# Patient Record
Sex: Female | Born: 1980 | Race: White | Hispanic: No | Marital: Married | Smoking: Never smoker
Health system: Southern US, Community
[De-identification: ages and names within clinical notes are randomized; demographics above are authoritative.]

## PROBLEM LIST (undated history)

## (undated) DIAGNOSIS — D649 Anemia, unspecified: Secondary | ICD-10-CM

## (undated) DIAGNOSIS — K219 Gastro-esophageal reflux disease without esophagitis: Secondary | ICD-10-CM

## (undated) DIAGNOSIS — J449 Chronic obstructive pulmonary disease, unspecified: Secondary | ICD-10-CM

## (undated) DIAGNOSIS — R000 Tachycardia, unspecified: Secondary | ICD-10-CM

## (undated) DIAGNOSIS — R519 Headache, unspecified: Secondary | ICD-10-CM

## (undated) HISTORY — DX: Tachycardia, unspecified: R00.0

## (undated) HISTORY — PX: OTHER SURGICAL HISTORY: SHX169

## (undated) HISTORY — DX: Chronic obstructive pulmonary disease, unspecified: J44.9

## (undated) HISTORY — PX: APPENDECTOMY: SHX54

---

## 2019-10-12 ENCOUNTER — Ambulatory Visit: Payer: BC Managed Care – PPO | Admitting: Neurology

## 2019-10-12 ENCOUNTER — Encounter: Payer: Self-pay | Admitting: Neurology

## 2019-10-12 ENCOUNTER — Other Ambulatory Visit: Payer: Self-pay

## 2019-10-12 VITALS — BP 106/67 | HR 96 | Temp 98.5°F | Ht 60.0 in | Wt 110.0 lb

## 2019-10-12 DIAGNOSIS — R251 Tremor, unspecified: Secondary | ICD-10-CM

## 2019-10-12 DIAGNOSIS — R3911 Hesitancy of micturition: Secondary | ICD-10-CM

## 2019-10-12 DIAGNOSIS — M79602 Pain in left arm: Secondary | ICD-10-CM

## 2019-10-12 DIAGNOSIS — M545 Low back pain, unspecified: Secondary | ICD-10-CM

## 2019-10-12 MED ORDER — CYCLOBENZAPRINE HCL 5 MG PO TABS: 5.0000 mg | ORAL_TABLET | Freq: Every day | ORAL | 3 refills | Status: AC

## 2019-10-12 NOTE — Progress Notes (Signed)
[facility]  PATIENT: Melissa Morse DOB: 11/08/1981  REFERRING DOCTOR OR PCP:  Glendale Chard (PCP); Dr. Annette Stable (Neurosurgery) SOURCE: Patient, notes from neurosurgery, imaging reports, lab reports, MRI images personally reviewed.  _________________________________   HISTORICAL  CHIEF COMPLAINT:  Chief Complaint  Patient presents with  . Referral    Referral for thoraic myelopathy room, back pain and arms shake all the time and legs shake pt alone     HISTORY OF PRESENT ILLNESS:  I had the pleasure of seeing your patient, Melissa Morse, at [facility] for neurologic consultation regarding a concern about thoracic myelopathy.  She is a 38 year old woman who was having lower back pain for the past year.   She has  trembling that started in her legs in August while driving to the beach and became more frequent a month ago.   Tremor started in her arm earlier this week.      Tremors are in both legs and the left arm.  The tremor fluctuates in intensity and she notes it more at night.   She has had urinary hesitancy for the past month as well.   She feels her legs are tired though not much weakness.   She denies numbness.    She saw Dr. Annette Stable September 28, 2019 and MRI of the lumbar and thoracic spine were ordered.    I personally reviewed the MRIs of the thoracic and lumbar spine performed at [facility] 09/29/2019.  The spinal cord appears normal.  There are minimal disc degenerative changes at T7-T8 and L5-S1 but no nerve root compression.  No spinal stenosis.  She reports a recent episode of tachycardia last week.   She reports pulse went from 78 to 160.   She went to the [facility] ED and her pulse was 124.   She was prescribed metoprolol and pulse is less than 100.      She has no trauma to her back.   She reports having 3 Epidural block for deliveries and 1 spinal for C-section delivery (4 boys, ages 72 to 52; three have autism).  She  denies depression or anxiety.  She is sleeping worse at night the last month due to bladder issues and tremor.    Labwork from 09/30/2019 showed normal electrolytes, TSH, CBC.  REVIEW OF SYSTEMS: Constitutional: No fevers, chills, sweats, or change in appetite Eyes: No visual changes, double vision, eye pain Ear, nose and throat: No hearing loss, ear pain, nasal congestion, sore throat Cardiovascular: No chest pain, palpitations Respiratory: No shortness of breath at rest or with exertion.   No wheezes GastrointestinaI: No nausea, vomiting, diarrhea, abdominal pain, fecal incontinence Genitourinary: No dysuria, urinary retention or frequency.  No nocturia. Musculoskeletal: No neck pain, back pain Integumentary: No rash, pruritus, skin lesions Neurological: as above Psychiatric: No depression at this time.  No anxiety Endocrine: No palpitations, diaphoresis, change in appetite, change in weigh or increased thirst Hematologic/Lymphatic: No anemia, purpura, petechiae. Allergic/Immunologic: No itchy/runny eyes, nasal congestion, recent allergic reactions, rashes  ALLERGIES: Not on File  HOME MEDICATIONS:  Current Outpatient Medications:  .  Albuterol Sulfate 108 (90 Base) MCG/ACT AEPB, Inhale into the lungs., Disp: , Rfl:  .  Fluticasone-Salmeterol (ADVAIR) 250-50 MCG/DOSE AEPB, Inhale into the lungs., Disp: , Rfl:  .  metoprolol succinate (TOPROL-XL) 25 MG 24 hr tablet, Take by mouth., Disp: , Rfl:  .  montelukast (SINGULAIR) 10 MG tablet, Take by mouth., Disp: , Rfl:  .  naproxen (NAPROSYN)  500 MG tablet, Take by mouth., Disp: , Rfl:  .  traMADol (ULTRAM) 50 MG tablet, Take by mouth., Disp: , Rfl:   PAST MEDICAL HISTORY: Past Medical History:  Diagnosis Date  . COPD (chronic obstructive pulmonary disease) ([place])   . Tachycardia     PAST SURGICAL HISTORY: C-section.  FAMILY HISTORY: No family history on file.  SOCIAL HISTORY:  Social History   Socioeconomic History   . Marital status: Married    Spouse name: Not on file  . Number of children: Not on file  . Years of education: Not on file  . Highest education level: Not on file  Occupational History  . Not on file  Social Needs  . Financial resource strain: Not on file  . Food insecurity    Worry: Not on file    Inability: Not on file  . Transportation needs    Medical: Not on file    Non-medical: Not on file  Tobacco Use  . Smoking status: Never Smoker  . Smokeless tobacco: Never Used  Substance and Sexual Activity  . Alcohol use: Not Currently  . Drug use: Not Currently  . Sexual activity: Not on file  Lifestyle  . Physical activity    Days per week: Not on file    Minutes per session: Not on file  . Stress: Not on file  Relationships  . Social Herbalist on phone: Not on file    Gets together: Not on file    Attends religious service: Not on file    Active member of club or organization: Not on file    Attends meetings of clubs or organizations: Not on file    Relationship status: Not on file  . Intimate partner violence    Fear of current or ex partner: Not on file    Emotionally abused: Not on file    Physically abused: Not on file    Forced sexual activity: Not on file  Other Topics Concern  . Not on file  Social History Narrative  . Not on file     PHYSICAL EXAM  Vitals:   10/12/19 1427  BP: 106/67  Pulse: 96  Temp: 98.5 F (36.9 C)  Weight: 110 lb (49.9 kg)  Height: 5' (1.524 m)    Body mass index is 21.48 kg/m.   General: The patient is well-developed and well-nourished and in no acute distress  HEENT:  Head is [place]/AT.  Sclera are anicteric.  Funduscopic exam shows normal optic discs and retinal vessels.  Neck: No carotid bruits are noted.  The neck is nontender.  Cardiovascular: The heart has a regular rate and rhythm with a normal S1 and S2. There were no murmurs, gallops or rubs.    Skin: Extremities are without rash or  edema.   Musculoskeletal:  Back is nontender  Neurologic Exam  Mental status: The patient is alert and oriented x 3 at the time of the examination. The patient has apparent normal recent and remote memory, with an apparently normal attention span and concentration ability.   Speech is normal.  Cranial nerves: Extraocular movements are full. Pupils are equal, round, and reactive to light and accomodation.  Visual fields are full.  Facial symmetry is present. There is good facial sensation to soft touch bilaterally.Facial strength is normal.  Trapezius and sternocleidomastoid strength is normal. No dysarthria is noted.  The tongue is midline, and the patient has symmetric elevation of the soft palate. No obvious hearing  deficits are noted.  Motor: She has a tremor and the left arm.  Amplitude and frequency varied at different times.  There was some distractibility.  Muscle bulk is normal.   Tone is normal. Strength is  5 / 5 in all 4 extremities.   Sensory: She reported reduced sensation to touch and vibration in the left arm and leg relative to the right.  Coordination: Cerebellar testing reveals good finger-nose-finger and heel-to-shin bilaterally.  Gait and station: Station is normal.   Gait is normal. Tandem gait is mildly wide.. Romberg is negative.   Reflexes: Deep tendon reflexes are symmetric and normal in arms, 3 at the knees and 2 at the ankles.   Plantar responses are flexor.      ASSESSMENT AND PLAN  Tremor - Plan: MR CERVICAL SPINE WO CONTRAST  Left arm pain - Plan: MR CERVICAL SPINE WO CONTRAST  Urinary hesitancy - Plan: MR CERVICAL SPINE WO CONTRAST  Bilateral low back pain without sciatica, unspecified chronicity   In summary, Melissa Morse is a 38 year old woman reporting a tremor that started in both legs and now mostly involves the left arm.  She also reported left-sided numbness on the physical exam today.  MRI of the thoracic and lumbar spine was normal for age with no spinal  cord pathology and no nerve root compression or spinal stenosis.  Due to the symptoms involving the left arm and leg, we will check an MRI of the cervical spine to determine if there is a compressive or intrinsic myelopathy that could be causing the symptoms. To help with her symptoms, I will add low-dose Flexeril.  If the evaluation is normal, I would anticipate a full recovery and would consider referring to physical therapy if symptoms persist.  She will return in a month or call sooner if there are new or worsening neurologic symptoms.  Thank you for asking me to see Melissa Morse.  Please let me know if I can be of further assistance with her or other patients in the future.   Richard A. Felecia Shelling, MD, [facility] [id number], [id number] PM Certified in Neurology, Clinical Neurophysiology, Sleep Medicine and Neuroimaging  [facility] [address] [place], [place] [address] [phone]

## 2019-10-19 ENCOUNTER — Telehealth: Payer: Self-pay | Admitting: Neurology

## 2019-10-19 NOTE — Telephone Encounter (Signed)
Just and FYI Dr. Felecia Shelling     I spoke with the patient this morning to schedule her MRI. She asked me what took so [place] for me to get back to her I informed her that her MRI was order on Thursday afternoon on 11/12/19 and we were closed on Friday and then close on Tuesday for the election. She then asked why was she not put on the schedule when she was here and I informed her because our process is we check everything with her insurance first before scheduling patients. She then wanted to know when the soonest she could have the MRI because her other Neurologist gets her in real soon. I informed her I had availability for Tuesday 10/24/19 or Wednesday 10/25/19 at our office. She then paused and stated she does not want to be a patient here anymore and wants her medical recorders sent to her. I informed her I would let medical recorders aware and I canceled her upcoming appointment with Amy on 11/13/19.

## 2019-10-19 NOTE — Telephone Encounter (Signed)
I mailed pt medical records out on 10/19/19.

## 2019-10-19 NOTE — Telephone Encounter (Signed)
Ok. Thank you.

## 2019-11-13 ENCOUNTER — Ambulatory Visit: Payer: BC Managed Care – PPO | Admitting: Family Medicine

## 2019-11-30 ENCOUNTER — Ambulatory Visit: Payer: BC Managed Care – PPO | Admitting: Neurology

## 2022-03-02 ENCOUNTER — Other Ambulatory Visit: Payer: Self-pay | Admitting: General Surgery

## 2022-03-02 DIAGNOSIS — D241 Benign neoplasm of right breast: Secondary | ICD-10-CM

## 2022-03-03 ENCOUNTER — Other Ambulatory Visit: Payer: Self-pay | Admitting: General Surgery

## 2022-03-03 DIAGNOSIS — D241 Benign neoplasm of right breast: Secondary | ICD-10-CM

## 2022-04-07 NOTE — Progress Notes (Signed)
Surgical Instructions ? ? ? Your procedure is scheduled on Tuesday, May 2nd, 2023. ? ? Report to [facility] Main Entrance "A" at 09:00 A.M., then check in with the Admitting office. ? Call this number if you have problems the morning of surgery: ? [phone] ? ? If you have any questions prior to your surgery date call [phone]: Open Monday-Friday 8am-4pm ? ? ? Remember: ? Do not eat after midnight the night before your surgery ? ?You may drink clear liquids until 08:00 the morning of your surgery.   ?Clear liquids allowed are: Water, Non-Citrus Juices (without pulp), Carbonated Beverages, Clear Tea, Black Coffee ONLY (NO MILK, CREAM OR POWDERED CREAMER of any kind), and Gatorade ?  ? Take these medicines the morning of surgery with A SIP OF WATER:  ? ?amantadine (SYMMETREL) ?primidone (MYSOLINE) ?propranolol (INDERAL)  ? ?If needed: ? ?Albuterol Sulfate  ?Fluticasone-Salmeterol (ADVAIR)  ? ?Please bring all inhalers with you the day of surgery.   ? ?As of today, STOP taking any Aspirin (unless otherwise instructed by your surgeon) Aleve, Naproxen, Ibuprofen, Motrin, Advil, Goody's, BC's, all herbal medications, fish oil, and all vitamins. ? ? ? The day of surgery: ?         ?Do not wear jewelry or makeup ?Do not wear lotions, powders, perfumes, or deodorant. ?Do not shave 48 hours prior to surgery.   ?Do not bring valuables to the hospital. ?Do not wear nail polish, gel polish, artificial nails, or any other type of covering on natural nails (fingers and toes) ?If you have artificial nails or gel coating that need to be removed by a nail salon, please have this removed prior to surgery. Artificial nails or gel coating may interfere with anesthesia's ability to adequately monitor your vital signs. ? ? ?[facility] is not responsible for any belongings or valuables. .  ? ?Do NOT Smoke (Tobacco/Vaping)  24 hours prior to your procedure ? ?If you use a CPAP at night, you may bring your mask for your overnight  stay. ?  ?Contacts, glasses, hearing aids, dentures or partials may not be worn into surgery, please bring cases for these belongings ?  ?For patients admitted to the hospital, discharge time will be determined by your treatment team. ?  ?Patients discharged the day of surgery will not be allowed to drive home, and someone needs to stay with them for 24 hours. ? ? ?SURGICAL WAITING ROOM VISITATION ?Patients having surgery or a procedure in a hospital may have two support people. ?Children under the age of 68 must have an adult with them who is not the patient. ?They may stay in the waiting area during the procedure and may switch out with other visitors. If the patient needs to stay at the hospital during part of their recovery, the visitor guidelines for inpatient rooms apply. ? ?Please refer to the [facility] website for the visitor guidelines for Inpatients (after your surgery is over and you are in a regular room).  ? ? ?Special instructions:   ? ?Oral Hygiene is also important to reduce your risk of infection.  Remember - BRUSH YOUR TEETH THE MORNING OF SURGERY WITH YOUR REGULAR TOOTHPASTE ? ? ?[facility]- Preparing For Surgery ? ?Before surgery, you can play an important role. Because skin is not sterile, your skin needs to be as free of germs as possible. You can reduce the number of germs on your skin by washing with CHG (chlorahexidine gluconate) Soap before surgery.  CHG is an  antiseptic cleaner which kills germs and bonds with the skin to continue killing germs even after washing.   ? ? ?Please do not use if you have an allergy to CHG or antibacterial soaps. If your skin becomes reddened/irritated stop using the CHG.  ?Do not shave (including legs and underarms) for at least 48 hours prior to first CHG shower. It is OK to shave your face. ? ?Please follow these instructions carefully. ?  ? ? Shower the NIGHT BEFORE SURGERY and the MORNING OF SURGERY with CHG Soap.  ? If you chose to wash your hair, wash  your hair first as usual with your normal shampoo. After you shampoo, rinse your hair and body thoroughly to remove the shampoo.  Then [employer] and genitals (private parts) with your normal soap and rinse thoroughly to remove soap. ? ?After that Use CHG Soap as you would any other liquid soap. You can apply CHG directly to the skin and wash gently with a scrungie or a clean washcloth.  ? ?Apply the CHG Soap to your body ONLY FROM THE NECK DOWN.  Do not use on open wounds or open sores. Avoid contact with your eyes, ears, mouth and genitals (private parts). Wash Face and genitals (private parts)  with your normal soap.  ? ?Wash thoroughly, paying special attention to the area where your surgery will be performed. ? ?Thoroughly rinse your body with warm water from the neck down. ? ?DO NOT shower/wash with your normal soap after using and rinsing off the CHG Soap. ? ?Pat yourself dry with a CLEAN TOWEL. ? ?Wear CLEAN PAJAMAS to bed the night before surgery ? ?Place CLEAN SHEETS on your bed the night before your surgery ? ?DO NOT SLEEP WITH PETS. ? ? ?Day of Surgery: ? ?Take a shower with CHG soap. ?Wear Clean/Comfortable clothing the morning of surgery ?Do not apply any deodorants/lotions.   ?Remember to brush your teeth WITH YOUR REGULAR TOOTHPASTE. ? ? ? ?If you received a COVID test during your pre-op visit, it is requested that you wear a mask when out in public, stay away from anyone that may not be feeling well, and notify your surgeon if you develop symptoms. If you have been in contact with anyone that has tested positive in the last 10 days, please notify your surgeon. ? ?  ?Please read over the following fact sheets that you were given.   ?

## 2022-04-08 ENCOUNTER — Encounter (HOSPITAL_COMMUNITY): Payer: Self-pay

## 2022-04-08 ENCOUNTER — Other Ambulatory Visit: Payer: Self-pay

## 2022-04-08 ENCOUNTER — Encounter (HOSPITAL_COMMUNITY)
Admission: RE | Admit: 2022-04-08 | Discharge: 2022-04-08 | Disposition: A | Discharge status: Home / Self Care | Payer: BC Managed Care – PPO | Source: Ambulatory Visit | Attending: General Surgery | Admitting: General Surgery

## 2022-04-08 VITALS — BP 125/85 | HR 92 | Temp 97.5°F | Resp 18 | Ht 60.0 in | Wt 123.4 lb

## 2022-04-08 DIAGNOSIS — Z01818 Encounter for other preprocedural examination: Secondary | ICD-10-CM | POA: Insufficient documentation

## 2022-04-08 HISTORY — DX: Headache, unspecified: R51.9

## 2022-04-08 HISTORY — DX: Anemia, unspecified: D64.9

## 2022-04-08 HISTORY — DX: Gastro-esophageal reflux disease without esophagitis: K21.9

## 2022-04-08 LAB — BASIC METABOLIC PANEL
Anion gap: 5 (ref 5–15)
BUN: 5 mg/dL — ABNORMAL LOW (ref 6–20)
CO2: 25 mmol/L (ref 22–32)
Calcium: 8.5 mg/dL — ABNORMAL LOW (ref 8.9–10.3)
Chloride: 107 mmol/L (ref 98–111)
Creatinine, Ser: 0.61 mg/dL (ref 0.44–1.00)
GFR, Estimated: >60 mL/min (ref >60)
Glucose, Bld: 80 mg/dL (ref 70–99)
Potassium: 4.3 mmol/L (ref 3.5–5.1)
Sodium: 137 mmol/L (ref 135–145)

## 2022-04-08 LAB — CBC
HCT: 41.8 % (ref 36.0–46.0)
Hemoglobin: 13.7 g/dL (ref 12.0–15.0)
MCH: 31.2 pg (ref 26.0–34.0)
MCHC: 32.8 g/dL (ref 30.0–36.0)
MCV: 95.2 fL (ref 80.0–100.0)
Platelets: 250 10*3/uL (ref 150–400)
RBC: 4.39 MIL/uL (ref 3.87–5.11)
RDW: 12.7 % (ref 11.5–15.5)
WBC: 7.6 10*3/uL (ref 4.0–10.5)
nRBC: 0 % (ref 0.0–0.2)

## 2022-04-08 LAB — PREGNANCY, URINE: Preg Test, Ur: NEGATIVE

## 2022-04-08 NOTE — Progress Notes (Signed)
PCP - Glendale Chard, MD ?Cardiologist - [facility] [place] ? ?PPM/ICD - denies ?Device Orders - n/a ?Rep Notified - n/a ? ?Chest x-ray - n/a ?EKG - 04/08/2022 ?Stress Test - denies ?ECHO - denies ?Cardiac Cath - denies ? ?Sleep Study - denies ?CPAP - n/a ? ?Fasting Blood Sugar - n/a ? ?Blood Thinner Instructions: n/a ? ?Aspirin Instructions: Patient was instructed: As of today, STOP taking any Aspirin (unless otherwise instructed by your surgeon) Aleve, Naproxen, Ibuprofen, Motrin, Advil, Goody's, BC's, all herbal medications, fish oil, and all vitamins. ? ?ERAS Protcol - yes, until 08:00 o'clock ? ?COVID TEST- n/a ? ? ?Anesthesia review: yes - seed placement; Hx of tachycardia ? ?Patient denies shortness of breath, fever, cough and chest pain at PAT appointment ? ? ?All instructions explained to the patient, with a verbal understanding of the material. Patient agrees to go over the instructions while at home for a better understanding. Patient also instructed to self quarantine after being tested for COVID-19. The opportunity to ask questions was provided. ?  ?

## 2022-04-13 ENCOUNTER — Ambulatory Visit
Admission: RE | Admit: 2022-04-13 | Discharge: 2022-04-13 | Disposition: A | Discharge status: Home / Self Care | Payer: BC Managed Care – PPO | Source: Ambulatory Visit | Attending: General Surgery | Admitting: General Surgery

## 2022-04-13 DIAGNOSIS — D241 Benign neoplasm of right breast: Secondary | ICD-10-CM

## 2022-04-13 NOTE — H&P (Addendum)
?REFERRING PHYSICIAN:  Dr. Suzie Portela, MD ?  ?PROVIDER:  Georgianne Fick, MD ?  ?Care Team: Patient Care Team: ?Juanita Laster, MD as PCP - General (Family Medicine)  ?  ?MRN: [id number] ?DOB: 28-Dec-1980 ? ?  ?Subjective  ?  ?Chief Complaint: Breast Problem ?  ?  ?  ?History of Present Illness: ?Melissa Morse is a 41 y.o. female who is seen today as an office consultation at the request of Dr. Suzie Portela for evaluation of Breast Problem ?Marland Kitchen   ?Patient is a 41 year old female who presented with right nipple discharge and abnormal diagnostic right mammogram.  No recent was seen for the nipple discharge, but she did have a 1.2 cm mass at 3:30 o'clock 3 cm from the nipple.  A core needle biopsy was performed of this and this demonstrated a fibroadenoma. ?  ?She does have breast soreness.  She has yellowish nipple discharge most nights.  She states that her breast feels like it did when it was lactating.  The discharge has never been bloody or milky. ?  ?She has not personally had cancer however she has a family history of breast cancer in her grandmother and colon cancer in an aunt. ?  ?She is accompanied by her best friend who I have operated on. ?  ?She also notes a nodular lesion on her left lateral lower thigh.  It has been there for several years.  However recently it has started to itch and become intermittently scaly. ?  ?  ?Diagnostic mammogram/ultrasound   01/30/22 [facility] ? ?ACR Breast Density Category c: The breast tissue is heterogeneously  ?dense, which may obscure small masses.  ? ?FINDINGS:  ?Spot compression cc and MLO views of the right breast are submitted.  ?Previously questioned mass in the lower-inner quadrant right breast  ?is persistent.  ? ?On physical exam, I could not express any nipple discharge.  ? ?Targeted ultrasound is performed, showing slight irregular  ?hypoechoic mass at the right breast 3:30 o'clock 3 cm from nipple  ?measuring 1.2 x 0.8 x 0 6 cm. Ultrasound of the right axilla is   ?negative.  ? ?IMPRESSION:  ?Suspicious findings.  ? ?RECOMMENDATION:  ?Ultrasound-guided core biopsy of right breast mass.  ? ?I have discussed the findings and recommendations with the patient.  ?If applicable, a reminder letter will be sent to the patient  ?regarding the next appointment.  ? ?BI-RADS CATEGORY  4: Suspicious.   ?  ?  ?  ?Pathology core needle biopsy: 02/04/22 ?Benign fibroepithelial lesion c/w fibroadenoma.   ?  ?  ?Review of Systems: ?A complete review of systems was obtained from the patient.  I have reviewed this information and discussed as appropriate with the patient.  See HPI as well for other ROS. ?  ?Review of Systems  ?Respiratory: Positive for cough.   ?Gastrointestinal: Positive for constipation and diarrhea.  ?Skin: Positive for itching and rash.  ?Neurological: Positive for headaches.  ?All other systems reviewed and are negative. ?  ?  ?  ?Medical History: ?Past Medical History  ?    ?Past Medical History:  ?Diagnosis Date  ? COPD (chronic obstructive pulmonary disease) (CMS-HCC)    ?  ?  ?  ?   ?Patient Active Problem List  ?Diagnosis  ? Fibroadenoma of right breast  ? Skin lesion of left leg  ?  ?  ?Past Surgical History  ?     ?Past Surgical History:  ?Procedure Laterality Date  ? APPENDECTOMY      ?  CESAREAN SECTION N/A    ?  ?  ?  ?Allergies  ?     ?Allergies  ?Allergen Reactions  ? Amoxicillin-Pot Clavulanate Nausea And Vomiting and Hives  ? Montelukast Other (See Comments) and Palpitations  ?    Tachycardia ?Tachycardia ?Tachycardia ?   ? Potassium Clavulanate Other (See Comments)  ?  ?  ?  ?      ?Current Outpatient Medications on File Prior to Visit  ?Medication Sig Dispense Refill  ? amantadine HCL (SYMMETREL) 100 mg capsule Take by mouth      ? propranoloL (INDERAL) 10 MG tablet Take by mouth      ? multivitamin tablet Take 1 tablet by mouth once daily      ? primidone (MYSOLINE) 50 MG tablet TAKE 2 TO 3 TABLETS DAILY      ?  ?No current facility-administered medications  on file prior to visit.  ?  ?  ?Family History  ?     ?Family History  ?Problem Relation Age of Onset  ? High blood pressure (Hypertension) Mother    ? Hyperlipidemia (Elevated cholesterol) Mother    ? Heart valve disease Mother    ? Diabetes Mother    ?  ?  ?  ?Social History  ?  ?    ?Tobacco Use  ?Smoking Status Former  ? Types: Cigarettes  ?Smokeless Tobacco Never  ?  ?  ?Social History  ?Social History  ?  ?     ?Socioeconomic History  ? Marital status: Married  ?Tobacco Use  ? Smoking status: Former  ?    Types: Cigarettes  ? Smokeless tobacco: Never  ?Vaping Use  ? Vaping Use: Never used  ?Substance and Sexual Activity  ? Alcohol use: Yes  ? Drug use: Never  ?  ?  ?  ?Objective:  ?  ?  ?   ?Vitals:  ?    ?BP: 102/80  ?Pulse: 98  ?Temp: 37.4 ?C (99.4 ?F)  ?SpO2: 98%  ?Weight: 56.2 kg (123 lb 12.8 oz)  ?Height: 152.4 cm (5')  ?  ?Body mass index is 24.18 kg/m?. ?  ?  ?  ?Gen:  No acute distress.  Well nourished and well groomed.   ?Neurological: Alert and oriented to person, place, and time. Coordination normal.  ?Head: Normocephalic and atraumatic.  ?Eyes: Conjunctivae are normal. Pupils are equal, round, and reactive to light. No scleral icterus.  ?Neck: Normal range of motion. Neck supple. No tracheal deviation or thyromegaly present.  ?Cardiovascular: Normal rate, regular rhythm, normal heart sounds and intact distal pulses.  Exam reveals no gallop and no friction rub.  No murmur heard. ?Breast: No palpable mass, no expressible nipple discharge.  No lymphadenopathy.  No nipple retraction.  Needle site from biopsy is visible in the medial right breast.  Both breasts are relatively dense. ?Respiratory: Effort normal.  No respiratory distress. No chest wall tenderness. Breath sounds normal.  No wheezes, rales or rhonchi.  ?GI: Soft. Bowel sounds are normal. The abdomen is soft and nontender.  There is no rebound and no guarding.  ?Musculoskeletal: Normal range of motion. Extremities are nontender.   ?Lymphadenopathy: No cervical, preauricular, postauricular or axillary adenopathy is present ?Skin: Skin is warm and dry. No rash noted. No diaphoresis. No erythema. No pallor. No clubbing, cyanosis, or edema.  5 mm lesion on the left lower lateral thigh.  It is slightly red and raised.  It has a scaly surface. ?Psychiatric: Normal mood and affect.  Behavior is normal. Judgment and thought content normal.  ?  ?  ?Labs ?n/a ?  ?Assessment and Plan:  ?  ?Fibroadenoma of right breast ?Patient has a recent diagnosis of a fibroadenoma at 330 on the right breast and has a prior fibroadenoma in the upper medial right breast. ?  ?We will do a seed localized excisional biopsy for both of these. ?  ?The surgical procedure was described to the patient.  I discussed the incision type and location and that we would need radiology involved on with a wire or seed marker and/or sentinel node.     ?  ?The risks and benefits of the procedure were described to the patient and she wishes to proceed.   ?  ?We discussed the risks bleeding, infection, damage to other structures, need for further procedures/surgeries.  We discussed the risk of seroma.  The patient was advised if the area in the breast in cancer, we may need to go back to surgery for additional tissue to obtain negative margins or for a lymph node biopsy. The patient was advised that these are the most common complications, but that others can occur as well.  They were advised against taking aspirin or other anti-inflammatory agents/blood thinners the week before surgery.   ?  ?  ?Skin lesion of left leg ?Given that the skin lesion is symptomatic and changing in appearance, I would do a punch biopsy of this.  Since she will be under anesthesia already, we will do this in the operating room. ?  ?   ?Milus Height, MD FACS ?Surgical Oncology, General Surgery, Trauma and Critical Care ?[facility] ?A [facility] ?

## 2022-04-14 ENCOUNTER — Other Ambulatory Visit: Payer: Self-pay

## 2022-04-14 ENCOUNTER — Ambulatory Visit
Admission: RE | Admit: 2022-04-14 | Discharge: 2022-04-14 | Disposition: A | Discharge status: Home / Self Care | Payer: BC Managed Care – PPO | Source: Ambulatory Visit | Attending: General Surgery | Admitting: General Surgery

## 2022-04-14 ENCOUNTER — Encounter (HOSPITAL_COMMUNITY)
Admission: RE | Disposition: A | Discharge status: Home / Self Care | Payer: Self-pay | Source: Home / Self Care | Attending: General Surgery

## 2022-04-14 ENCOUNTER — Encounter (HOSPITAL_COMMUNITY): Payer: Self-pay | Admitting: General Surgery

## 2022-04-14 ENCOUNTER — Ambulatory Visit (HOSPITAL_COMMUNITY)
Admission: RE | Admit: 2022-04-14 | Discharge: 2022-04-14 | Disposition: A | Discharge status: Home / Self Care | Payer: BC Managed Care – PPO | Attending: General Surgery | Admitting: General Surgery

## 2022-04-14 ENCOUNTER — Ambulatory Visit (HOSPITAL_COMMUNITY): Payer: BC Managed Care – PPO | Admitting: Anesthesiology

## 2022-04-14 DIAGNOSIS — J449 Chronic obstructive pulmonary disease, unspecified: Secondary | ICD-10-CM | POA: Insufficient documentation

## 2022-04-14 DIAGNOSIS — N6021 Fibroadenosis of right breast: Secondary | ICD-10-CM | POA: Diagnosis present

## 2022-04-14 DIAGNOSIS — D241 Benign neoplasm of right breast: Secondary | ICD-10-CM

## 2022-04-14 DIAGNOSIS — K219 Gastro-esophageal reflux disease without esophagitis: Secondary | ICD-10-CM | POA: Insufficient documentation

## 2022-04-14 DIAGNOSIS — Z79899 Other long term (current) drug therapy: Secondary | ICD-10-CM | POA: Insufficient documentation

## 2022-04-14 DIAGNOSIS — N6011 Diffuse cystic mastopathy of right breast: Secondary | ICD-10-CM | POA: Insufficient documentation

## 2022-04-14 DIAGNOSIS — D2372 Other benign neoplasm of skin of left lower limb, including hip: Secondary | ICD-10-CM | POA: Insufficient documentation

## 2022-04-14 DIAGNOSIS — Z803 Family history of malignant neoplasm of breast: Secondary | ICD-10-CM | POA: Insufficient documentation

## 2022-04-14 HISTORY — PX: LESION REMOVAL: SHX5196

## 2022-04-14 HISTORY — PX: RADIOACTIVE SEED GUIDED EXCISIONAL BREAST BIOPSY: SHX6490

## 2022-04-14 SURGERY — RADIOACTIVE SEED GUIDED BREAST BIOPSY
Anesthesia: General | Site: Leg Upper | Laterality: Right

## 2022-04-14 MED ORDER — CHLORHEXIDINE GLUCONATE 0.12 % MT SOLN
15.0000 mL | Freq: Once | OROMUCOSAL | Status: AC
  Administered 2022-04-14: 15 mL via OROMUCOSAL

## 2022-04-14 MED ORDER — ORAL CARE MOUTH RINSE: 15.0000 mL | Freq: Once | OROMUCOSAL | Status: AC

## 2022-04-14 MED ORDER — OXYCODONE HCL 5 MG/5ML PO SOLN: 5.0000 mg | Freq: Once | ORAL | Status: DC | PRN

## 2022-04-14 MED ORDER — BUPIVACAINE-EPINEPHRINE (PF) 0.25% -1:200000 IJ SOLN
INTRAMUSCULAR | Status: AC
  Filled 2022-04-14: qty 30

## 2022-04-14 MED ORDER — MIDAZOLAM HCL 2 MG/2ML IJ SOLN
INTRAMUSCULAR | Status: DC | PRN
Start: 2022-04-14 — End: 2022-04-14
  Administered 2022-04-14: 2 mg via INTRAVENOUS

## 2022-04-14 MED ORDER — FENTANYL CITRATE (PF) 250 MCG/5ML IJ SOLN
INTRAMUSCULAR | Status: DC | PRN
  Administered 2022-04-14: 50 ug via INTRAVENOUS
  Administered 2022-04-14 (×2): 25 ug via INTRAVENOUS
  Administered 2022-04-14: 50 ug via INTRAVENOUS
  Administered 2022-04-14 (×2): 25 ug via INTRAVENOUS

## 2022-04-14 MED ORDER — PROPOFOL 10 MG/ML IV BOLUS
INTRAVENOUS | Status: AC
  Filled 2022-04-14: qty 20

## 2022-04-14 MED ORDER — CIPROFLOXACIN IN D5W 400 MG/200ML IV SOLN
INTRAVENOUS | Status: AC
  Filled 2022-04-14: qty 200

## 2022-04-14 MED ORDER — LIDOCAINE 2% (20 MG/ML) 5 ML SYRINGE
INTRAMUSCULAR | Status: DC | PRN
  Administered 2022-04-14: 60 mg via INTRAVENOUS

## 2022-04-14 MED ORDER — FENTANYL CITRATE (PF) 100 MCG/2ML IJ SOLN: 25.0000 ug | INTRAMUSCULAR | Status: DC | PRN

## 2022-04-14 MED ORDER — MIDAZOLAM HCL 2 MG/2ML IJ SOLN
INTRAMUSCULAR | Status: AC
  Filled 2022-04-14: qty 2

## 2022-04-14 MED ORDER — CHLORHEXIDINE GLUCONATE CLOTH 2 % EX PADS: 6.0000 | MEDICATED_PAD | Freq: Once | CUTANEOUS | Status: DC

## 2022-04-14 MED ORDER — PROPOFOL 10 MG/ML IV BOLUS
INTRAVENOUS | Status: DC | PRN
  Administered 2022-04-14: 120 mg via INTRAVENOUS

## 2022-04-14 MED ORDER — 0.9 % SODIUM CHLORIDE (POUR BTL) OPTIME
TOPICAL | Status: DC | PRN
  Administered 2022-04-14: 1000 mL

## 2022-04-14 MED ORDER — OXYCODONE HCL 5 MG PO TABS: 5.0000 mg | ORAL_TABLET | Freq: Once | ORAL | Status: DC | PRN

## 2022-04-14 MED ORDER — FENTANYL CITRATE (PF) 250 MCG/5ML IJ SOLN
INTRAMUSCULAR | Status: AC
  Filled 2022-04-14: qty 5

## 2022-04-14 MED ORDER — PROPOFOL 1000 MG/100ML IV EMUL
INTRAVENOUS | Status: AC
  Filled 2022-04-14: qty 100

## 2022-04-14 MED ORDER — LIDOCAINE HCL (PF) 1 % IJ SOLN
INTRAMUSCULAR | Status: AC
  Filled 2022-04-14: qty 30

## 2022-04-14 MED ORDER — DEXAMETHASONE SODIUM PHOSPHATE 10 MG/ML IJ SOLN
INTRAMUSCULAR | Status: DC | PRN
Start: 2022-04-14 — End: 2022-04-14
  Administered 2022-04-14: 10 mg via INTRAVENOUS

## 2022-04-14 MED ORDER — SCOPOLAMINE 1 MG/3DAYS TD PT72
1.0000 | MEDICATED_PATCH | Freq: Once | TRANSDERMAL | Status: DC
  Administered 2022-04-14: 1.5 mg via TRANSDERMAL
  Filled 2022-04-14: qty 1

## 2022-04-14 MED ORDER — LACTATED RINGERS IV SOLN: INTRAVENOUS | Status: DC

## 2022-04-14 MED ORDER — ONDANSETRON HCL 4 MG/2ML IJ SOLN
INTRAMUSCULAR | Status: DC | PRN
  Administered 2022-04-14: 4 mg via INTRAVENOUS

## 2022-04-14 MED ORDER — ACETAMINOPHEN 500 MG PO TABS
1000.0000 mg | ORAL_TABLET | ORAL | Status: AC
  Administered 2022-04-14: 1000 mg via ORAL
  Filled 2022-04-14: qty 2

## 2022-04-14 MED ORDER — CIPROFLOXACIN IN D5W 400 MG/200ML IV SOLN
400.0000 mg | Freq: Once | INTRAVENOUS | Status: AC
  Administered 2022-04-14: 400 mg via INTRAVENOUS

## 2022-04-14 MED ORDER — KETOROLAC TROMETHAMINE 15 MG/ML IJ SOLN
INTRAMUSCULAR | Status: DC | PRN
  Administered 2022-04-14: 15 mg via INTRAVENOUS

## 2022-04-14 MED ORDER — LIDOCAINE HCL 1 % IJ SOLN
INTRAMUSCULAR | Status: DC | PRN
  Administered 2022-04-14: 44 mL

## 2022-04-14 MED ORDER — OXYCODONE HCL 5 MG PO TABS: 5.0000 mg | ORAL_TABLET | Freq: Four times a day (QID) | ORAL | 0 refills | Status: AC | PRN

## 2022-04-14 MED ORDER — DROPERIDOL 2.5 MG/ML IJ SOLN: 0.6250 mg | Freq: Once | INTRAMUSCULAR | Status: DC | PRN

## 2022-04-14 SURGICAL SUPPLY — 51 items
BAG COUNTER SPONGE SURGICOUNT (BAG) ×3 IMPLANT
BINDER BREAST LRG (GAUZE/BANDAGES/DRESSINGS) ×1 IMPLANT
BINDER BREAST XLRG (GAUZE/BANDAGES/DRESSINGS) IMPLANT
BNDG COHESIVE 4X5 TAN STRL (GAUZE/BANDAGES/DRESSINGS) ×2 IMPLANT
CANISTER SUCT 3000ML PPV (MISCELLANEOUS) ×3 IMPLANT
CHLORAPREP W/TINT 26 (MISCELLANEOUS) ×3 IMPLANT
CLIP TI LARGE 6 (CLIP) ×3 IMPLANT
CLIP TI MEDIUM 24 (CLIP) ×3 IMPLANT
CNTNR URN SCR LID CUP LEK RST (MISCELLANEOUS) IMPLANT
CONT SPEC 4OZ STRL OR WHT (MISCELLANEOUS)
COVER PROBE W GEL 5X96 (DRAPES) ×3 IMPLANT
COVER SURGICAL LIGHT HANDLE (MISCELLANEOUS) ×3 IMPLANT
DERMABOND ADVANCED (GAUZE/BANDAGES/DRESSINGS) ×1
DERMABOND ADVANCED .7 DNX12 (GAUZE/BANDAGES/DRESSINGS) ×2 IMPLANT
DEVICE DUBIN SPECIMEN MAMMOGRA (MISCELLANEOUS) ×2 IMPLANT
DRAPE CHEST BREAST 15X10 FENES (DRAPES) ×3 IMPLANT
DRAPE SURG 17X23 STRL (DRAPES) IMPLANT
DRSG TEGADERM 2-3/8X2-3/4 SM (GAUZE/BANDAGES/DRESSINGS) ×1 IMPLANT
ELECT COATED BLADE 2.86 ST (ELECTRODE) ×3 IMPLANT
ELECT REM PT RETURN 9FT ADLT (ELECTROSURGICAL) ×3
ELECTRODE REM PT RTRN 9FT ADLT (ELECTROSURGICAL) ×2 IMPLANT
GAUZE SPONGE 4X4 12PLY STRL LF (GAUZE/BANDAGES/DRESSINGS) ×1 IMPLANT
GLOVE BIO SURGEON STRL SZ 6 (GLOVE) ×3 IMPLANT
GLOVE INDICATOR 6.5 STRL GRN (GLOVE) ×3 IMPLANT
GOWN STRL REUS W/ TWL LRG LVL3 (GOWN DISPOSABLE) ×2 IMPLANT
GOWN STRL REUS W/TWL 2XL LVL3 (GOWN DISPOSABLE) ×3 IMPLANT
GOWN STRL REUS W/TWL LRG LVL3 (GOWN DISPOSABLE) ×2
KIT BASIN OR (CUSTOM PROCEDURE TRAY) ×3 IMPLANT
KIT MARKER MARGIN INK (KITS) ×3 IMPLANT
LIGHT WAVEGUIDE WIDE FLAT (MISCELLANEOUS) ×1 IMPLANT
NDL 18GX1X1/2 (RX/OR ONLY) (NEEDLE) IMPLANT
NDL FILTER BLUNT 18X1 1/2 (NEEDLE) IMPLANT
NDL HYPO 25GX1X1/2 BEV (NEEDLE) ×2 IMPLANT
NEEDLE 18GX1X1/2 (RX/OR ONLY) (NEEDLE) IMPLANT
NEEDLE FILTER BLUNT 18X 1/2SAF (NEEDLE)
NEEDLE FILTER BLUNT 18X1 1/2 (NEEDLE) IMPLANT
NEEDLE HYPO 25GX1X1/2 BEV (NEEDLE) ×3 IMPLANT
NS IRRIG 1000ML POUR BTL (IV SOLUTION) ×3 IMPLANT
PACK GENERAL/GYN (CUSTOM PROCEDURE TRAY) ×3 IMPLANT
PACK UNIVERSAL I (CUSTOM PROCEDURE TRAY) ×3 IMPLANT
PAD ABD 8X10 STRL (GAUZE/BANDAGES/DRESSINGS) ×1 IMPLANT
PUNCH BIOPSY DERMAL 6MM STRL (MISCELLANEOUS) ×1 IMPLANT
SPONGE GAUZE 2X2 8PLY STRL LF (GAUZE/BANDAGES/DRESSINGS) ×1 IMPLANT
STOCKINETTE IMPERVIOUS 9X36 MD (GAUZE/BANDAGES/DRESSINGS) ×2 IMPLANT
SUT MNCRL AB 4-0 PS2 18 (SUTURE) ×4 IMPLANT
SUT VIC AB 2-0 SH 27 (SUTURE) ×1
SUT VIC AB 2-0 SH 27XBRD (SUTURE) IMPLANT
SUT VIC AB 3-0 SH 8-18 (SUTURE) ×3 IMPLANT
SYR CONTROL 10ML LL (SYRINGE) ×3 IMPLANT
TOWEL GREEN STERILE (TOWEL DISPOSABLE) ×3 IMPLANT
TOWEL GREEN STERILE FF (TOWEL DISPOSABLE) ×3 IMPLANT

## 2022-04-14 NOTE — Op Note (Signed)
Right Breast Radioactive seed localized excisional biopsy x 2, punch biopsy of left thigh lesion. ? ?Indications: This patient presents with history of abnormal right mammogram with two fibroadenomas and a persistent left thigh lesion that had skin changes and was itching. ? ?Pre-operative Diagnosis: abnormal right mammogram, left thigh skin lesion  ? ?Post-operative Diagnosis: abnormal right mammogram, left thigh skin lesion ? ?Surgeon: Stark Klein  ? ?Anesthesia: General endotracheal anesthesia ? ?ASA Class: 2 ? ?Procedure Details  ?The patient was seen in the Holding Room. The risks, benefits, complications, treatment options, and expected outcomes were discussed with the patient. The possibilities of bleeding, infection, the need for additional procedures, failure to diagnose a condition, and creating a complication requiring transfusion or operation were discussed with the patient. The patient concurred with the proposed plan, giving informed consent.  The site of surgery properly noted/marked. The patient was taken to Operating Room # 2, identified, and the procedure verified as right Breast seed localized excisional biopsy x 2 and excision of left thigh lesion. A Time Out was held and the above information confirmed. ? ?The right breast and chest were prepped and draped in standard fashion. A superior circumareolar incision was made near the previously placed radioactive seeds.  The 1 o'clock mass was addressed first.  Dissection was carried down around the point of maximum signal intensity. The cautery was used to perform the dissection.   The specimen was inked with the margin marker paint kit.    Specimen radiography confirmed inclusion of the mammographic lesion, the clip, and the seed.  The cautery was then used similarly to obtain the 3:30 lesion.  The background signal in the breast was zero.  Hemostasis was achieved with cautery.  The wound was irrigated.  A 2-0 deeper suture was placed to help cover  the defect.  The skin was then closed with 3-0 vicryl interrupted deep dermal sutures and 4-0 monocryl running subcuticular suture.   ?   ?The left thigh was prepped and draped also.  The lesion was removed in its entirety with a 6 mm punch biopsy.  Hemostasis was achieved.  This was closed with a 4-0 monocryl single interrupted suture.   ? ?Sterile dressings were applied. At the end of the operation, all sponge, instrument, and needle counts were correct. ? ?Findings: ?Seed, clip in specimen #1 and seed, clip in specimen #2.  Very dense breast tissue ? ?Estimated Blood Loss:  min ?        ?Specimens: right breast tissue with seed 1 o'clock, right breast tissue with seed 3:30 o'clock ?        ?Complications:  None; patient tolerated the procedure well. ?        ?Disposition: PACU - hemodynamically stable. ?        ?Condition: stable ? ?

## 2022-04-14 NOTE — Discharge Instructions (Addendum)
[facility] Office Phone Number [phone]  BREAST BIOPSY/ PARTIAL MASTECTOMY: POST OP INSTRUCTIONS  Always review your discharge instruction sheet given to you by the facility where your surgery was performed.  IF YOU HAVE DISABILITY OR FAMILY LEAVE FORMS, YOU MUST BRING THEM TO THE OFFICE FOR PROCESSING.  DO NOT GIVE THEM TO YOUR DOCTOR.  Take 2 tylenol (acetominophen) three times a day for 3 days.  If you still have pain, add ibuprofen with food in between if able to take this (if you have kidney issues or stomach issues, do not take ibuprofen).  If both of those are not enough, add the narcotic pain pill.  If you find you are needing a lot of this overnight after surgery, call the next morning for a refill.    Prescriptions will not be filled after 5pm or on week-ends. Take your usually prescribed medications unless otherwise directed You should eat very light the first 24 hours after surgery, such as soup, crackers, pudding, etc.  Resume your normal diet the day after surgery. Most patients will experience some swelling and bruising in the breast.  Ice packs and a good support bra will help.  Swelling and bruising can take several days to resolve.  It is common to experience some constipation if taking pain medication after surgery.  Increasing fluid intake and taking a stool softener will usually help or prevent this problem from occurring.  A mild laxative (Milk of Magnesia or Miralax) should be taken according to package directions if there are no bowel movements after 48 hours. Unless discharge instructions indicate otherwise, you may remove your bandages 48 hours after surgery, and you may shower at that time.  You may have steri-strips (small skin tapes) in place directly over the incision.  These strips should be left on the skin at least for for 7-10 days.    ACTIVITIES:  You may resume regular daily activities (gradually increasing) beginning the next day.  Wearing a  good support bra or sports bra (or the breast binder) minimizes pain and swelling.  You may have sexual intercourse when it is comfortable. No heavy lifting for 1-2 weeks (not over around 10 pounds).  You may drive when you no longer are taking prescription pain medication, you can comfortably wear a seatbelt, and you can safely maneuver your car and apply brakes. RETURN TO WORK:  __________3-14 days depending on job. _______________ You should see your doctor in the office for a follow-up appointment approximately two weeks after your surgery.  Your doctor's nurse will typically make your follow-up appointment when she calls you with your pathology report.  Expect your pathology report 3-4 business days after your surgery.  You may call to check if you do not hear from us after three days.   WHEN TO CALL YOUR DOCTOR: Fever over 101.0 Nausea and/or vomiting. Extreme swelling or bruising. Continued bleeding from incision. Increased pain, redness, or drainage from the incision.  The clinic staff is available to answer your questions during regular business hours.  Please don't hesitate to call and ask to speak to one of the nurses for clinical concerns.  If you have a medical emergency, go to the nearest emergency room or call 911.  A surgeon from [facility] is always on call at the hospital.  For further questions, please visit [email or web address]   

## 2022-04-14 NOTE — Anesthesia Preprocedure Evaluation (Addendum)
Anesthesia Evaluation  ?Patient identified by MRN, date of birth, ID band ?Patient awake ? ? ? ?Reviewed: ?Allergy & Precautions, NPO status , Patient's Chart, lab work & pertinent test results ? ?History of Anesthesia Complications ?Negative for: history of anesthetic complications ? ?Airway ?Mallampati: II ? ?TM Distance: <3 FB ?Neck ROM: Full ? ? ? Dental ?no notable dental hx. ? ?  ?Pulmonary ?COPD,  ?  ?Pulmonary exam normal ? ? ? ? ? ? ? Cardiovascular ?negative cardio ROS ?Normal cardiovascular exam ? ? ?  ?Neuro/Psych ?negative neurological ROS ? negative psych ROS  ? GI/Hepatic ?Neg liver ROS, GERD  ,  ?Endo/Other  ?negative endocrine ROS ? Renal/GU ?negative Renal ROS  ?negative genitourinary ?  ?Musculoskeletal ?negative musculoskeletal ROS ?(+)  ? Abdominal ?  ?Peds ? Hematology ?negative hematology ROS ?(+)   ?Anesthesia Other Findings ?RIGHT BREAST FIBROADENOMA X2 AND LEFT LEG SKIN LESION ? Reproductive/Obstetrics ?negative OB ROS ? ?  ? ? ? ? ? ? ? ? ? ? ? ? ? ?  ?  ? ? ? ? ? ? ? ?Anesthesia Physical ?Anesthesia Plan ? ?ASA: 2 ? ?Anesthesia Plan:   ? ?Post-op Pain Management: Tylenol PO (pre-op)* and Toradol IV (intra-op)*  ? ?Induction: Intravenous ? ?PONV Risk Score and Plan: 2 and Treatment may vary due to age or medical condition, Midazolam, Dexamethasone, Ondansetron and Scopolamine patch - Pre-op ? ?Airway Management Planned: LMA ? ?Additional Equipment: None ? ?Intra-op Plan:  ? ?Post-operative Plan: Extubation in OR ? ?Informed Consent: I have reviewed the patients History and Physical, chart, labs and discussed the procedure including the risks, benefits and alternatives for the proposed anesthesia with the patient or authorized representative who has indicated his/her understanding and acceptance.  ? ? ? ?Dental advisory given ? ?Plan Discussed with: CRNA ? ?Anesthesia Plan Comments:   ? ? ? ? ? ?Anesthesia Quick Evaluation ? ?

## 2022-04-14 NOTE — Progress Notes (Signed)
Post-procedure: radioactive seed right breast biopsy x2 and excision of left leg lesion. ?Per Dr. Barry Dienes, right breast lumpectomy with radioactive seed number 1 was 1 o'clock, and right breast lumpectomy with radioactive seed number 2 was three thirty. ?

## 2022-04-14 NOTE — Interval H&P Note (Signed)
History and Physical Interval Note: ? ?04/14/2022 ?10:26 AM ? ?Melissa Morse  has presented today for surgery, with the diagnosis of RIGHT BREAST FIBROADENOMA X2 AND LEFT LEG SKIN LESION.  The various methods of treatment have been discussed with the patient and family. There was a second lesion located that also needed excision.  After consideration of risks, benefits and other options for treatment, the patient has consented to  Procedure(s): ?RADIOACTIVE SEED GUIDED EXCISIONAL RIGHT BREAST BIOPSY X2 (Right) ?EXCISION OF LEFT LEG SKIN LESION (Left) as a surgical intervention.  The patient's history has been reviewed, patient examined, no change in status, stable for surgery.  I have reviewed the patient's chart and labs.  Questions were answered to the patient's satisfaction.   ? ? ?Stark Klein ? ? ?

## 2022-04-14 NOTE — Anesthesia Postprocedure Evaluation (Signed)
Anesthesia Post Note ? ?Patient: Melissa Morse ? ?Procedure(s) Performed: RADIOACTIVE SEED GUIDED EXCISIONAL RIGHT BREAST BIOPSY X2 (Right: Breast) ?EXCISION OF LEFT LEG SKIN LESION (Left: Leg Upper) ? ?  ? ?Patient location during evaluation: PACU ?Anesthesia Type: General ?Level of consciousness: awake and alert ?Pain management: pain level controlled ?Vital Signs Assessment: post-procedure vital signs reviewed and stable ?Respiratory status: spontaneous breathing, nonlabored ventilation, respiratory function stable and patient connected to nasal cannula oxygen ?Cardiovascular status: blood pressure returned to baseline and stable ?Postop Assessment: no apparent nausea or vomiting ?Anesthetic complications: no ? ? ?No notable events documented. ? ?Last Vitals:  ?Vitals:  ? 04/14/22 1505 04/14/22 1521  ?BP: 137/80 138/72  ?Pulse: 87 64  ?Resp: 20 20  ?Temp:  (!) 36.3 ?C  ?SpO2: 100% 99%  ?  ?Last Pain:  ?Vitals:  ? 04/14/22 1521  ?TempSrc:   ?PainSc: 2   ? ? ?  ?  ?  ?  ?  ?  ? ?March Rummage Chrisopher Pustejovsky ? ? ? ? ?

## 2022-04-14 NOTE — Anesthesia Procedure Notes (Signed)
Procedure Name: LMA Insertion ?Date/Time: 04/14/2022 12:59 PM ?Performed by: Reeves Dam, CRNA ?Pre-anesthesia Checklist: Patient identified, Patient being monitored, Timeout performed, Emergency Drugs available and Suction available ?Patient Re-evaluated:Patient Re-evaluated prior to induction ?Oxygen Delivery Method: Circle system utilized ?Preoxygenation: Pre-oxygenation with 100% oxygen ?Induction Type: IV induction ?Ventilation: Mask ventilation without difficulty ?LMA: LMA inserted ?LMA Size: 4.0 ?Tube type: Oral ?Number of attempts: 1 ?Placement Confirmation: positive ETCO2 and breath sounds checked- equal and bilateral ?Tube secured with: Tape ?Dental Injury: Teeth and Oropharynx as per pre-operative assessment  ? ? ? ? ?

## 2022-04-14 NOTE — Transfer of Care (Signed)
Immediate Anesthesia Transfer of Care Note ? ?Patient: Melissa Morse ? ?Procedure(s) Performed: RADIOACTIVE SEED GUIDED EXCISIONAL RIGHT BREAST BIOPSY X2 (Right: Breast) ?EXCISION OF LEFT LEG SKIN LESION (Left: Leg Upper) ? ?Patient Location: PACU ? ?Anesthesia Type:General ? ?Level of Consciousness: awake, alert  and oriented ? ?Airway & Oxygen Therapy: Patient Spontanous Breathing ? ?Post-op Assessment: Report given to RN, Post -op Vital signs reviewed and stable and Patient moving all extremities X 4 ? ?Post vital signs: Reviewed and stable ? ?Last Vitals:  ?Vitals Value Taken Time  ?BP 91/58 04/14/22 1436  ?Temp    ?Pulse 93 04/14/22 1439  ?Resp 24 04/14/22 1439  ?SpO2 99 % 04/14/22 1439  ?Vitals shown include unvalidated device data. ? ?Last Pain:  ?Vitals:  ? 04/14/22 1014  ?TempSrc:   ?PainSc: 3   ?   ? ?  ? ?Complications: No notable events documented. ?

## 2022-04-15 ENCOUNTER — Encounter (HOSPITAL_COMMUNITY): Payer: Self-pay | Admitting: General Surgery

## 2022-04-16 ENCOUNTER — Encounter (HOSPITAL_COMMUNITY): Payer: Self-pay

## 2022-04-16 LAB — SURGICAL PATHOLOGY

## 2023-04-11 IMAGING — MG MM PLC BREAST LOC DEV 1ST LESION INC*R*
6 series · 6 of 6 positions shown · non-contrast
Comparison: Previous exam(s).

CLINICAL DATA: 40-year-old female presenting for radioactive seed
localization of 2 sites in the right breast.

EXAM:
MAMMOGRAPHIC GUIDED RADIOACTIVE SEED LOCALIZATION OF THE RIGHT
BREAST

[R CC (1 of 2)]
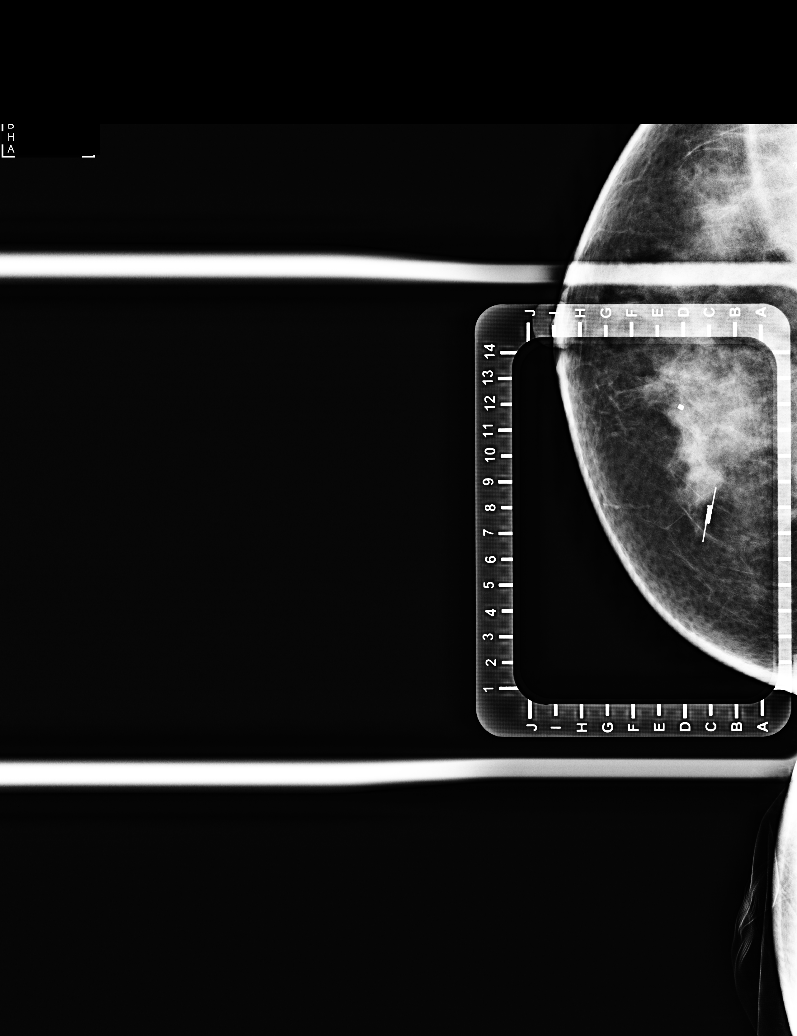

[R ML (1 of 4)]
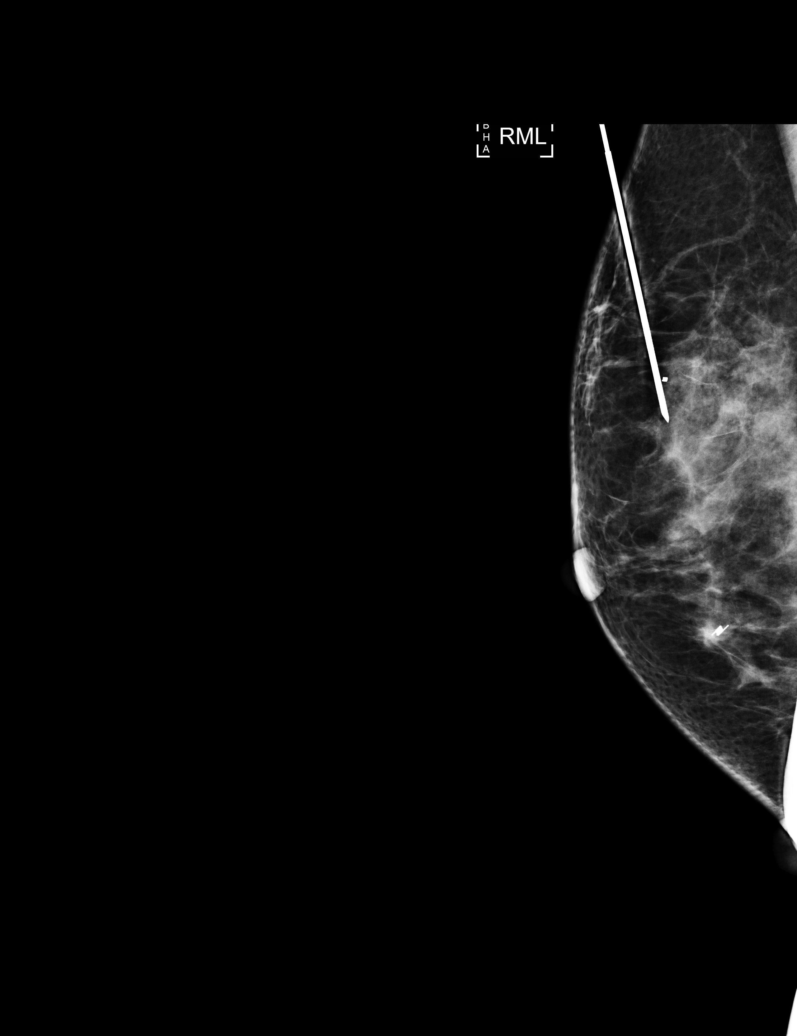

[R ML (2 of 4)]
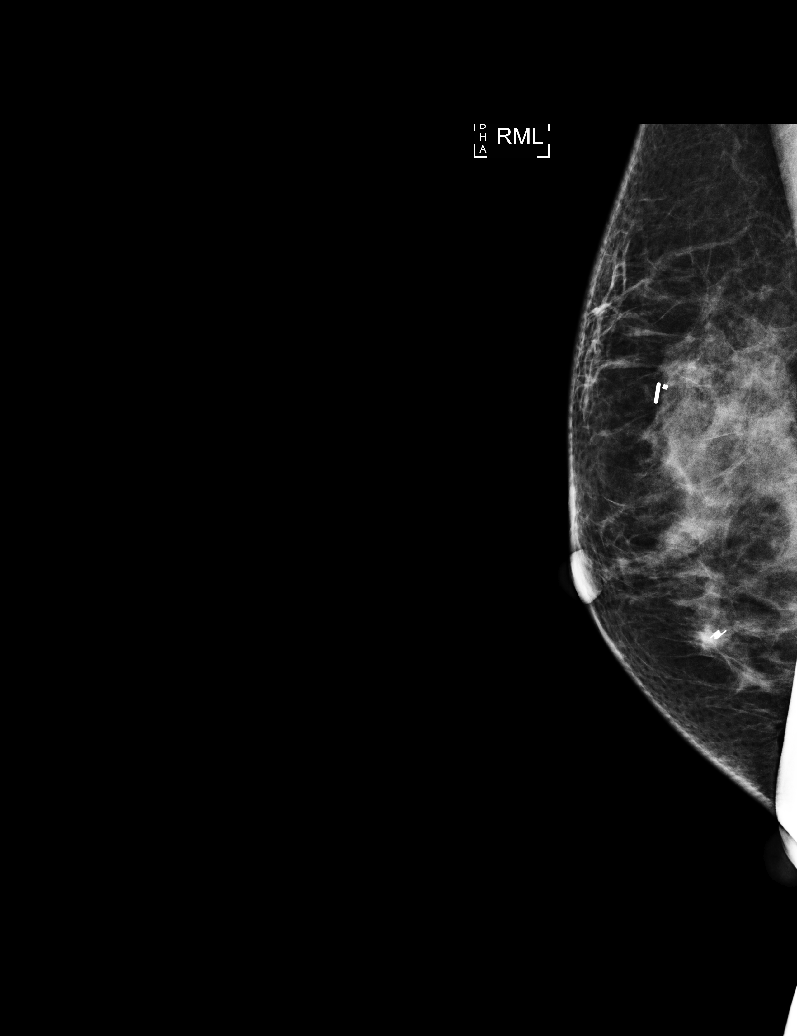

[R ML (3 of 4)]
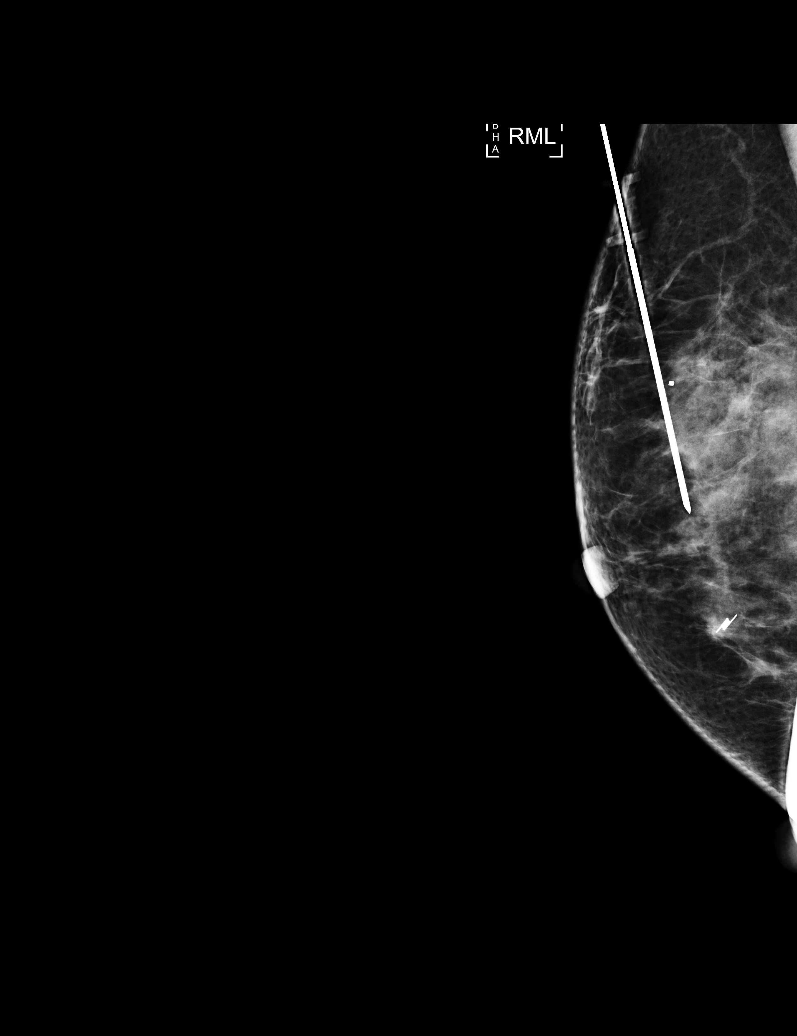

[R ML (4 of 4)]
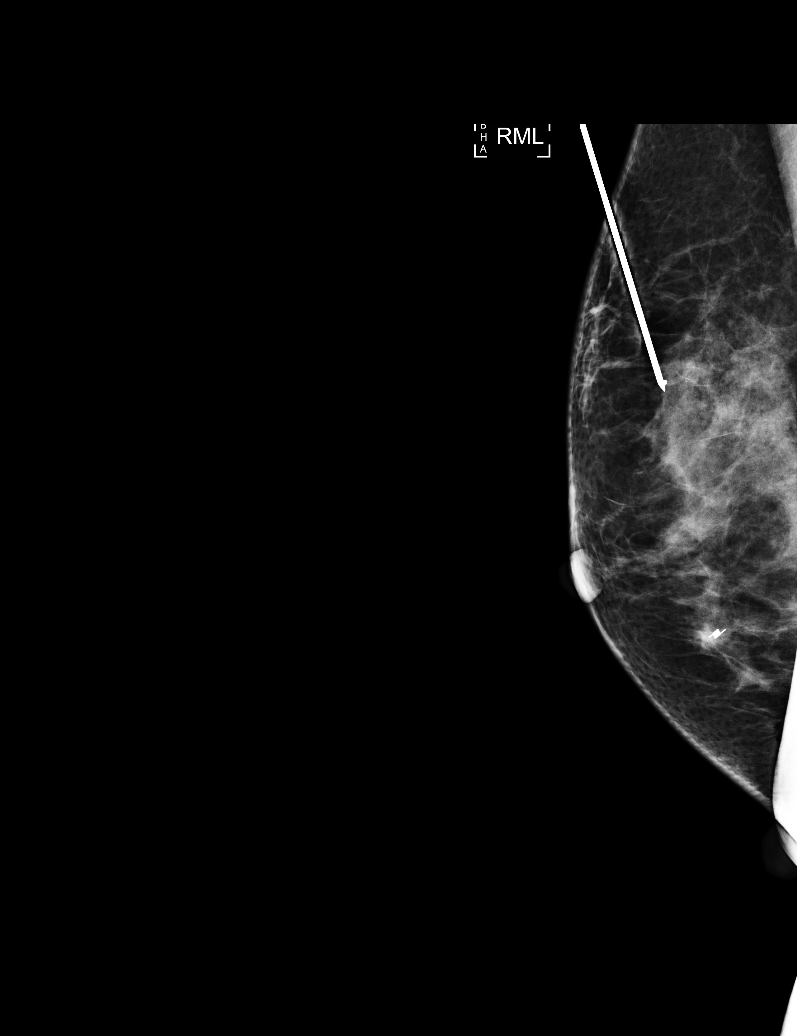

[R CC (2 of 2)]
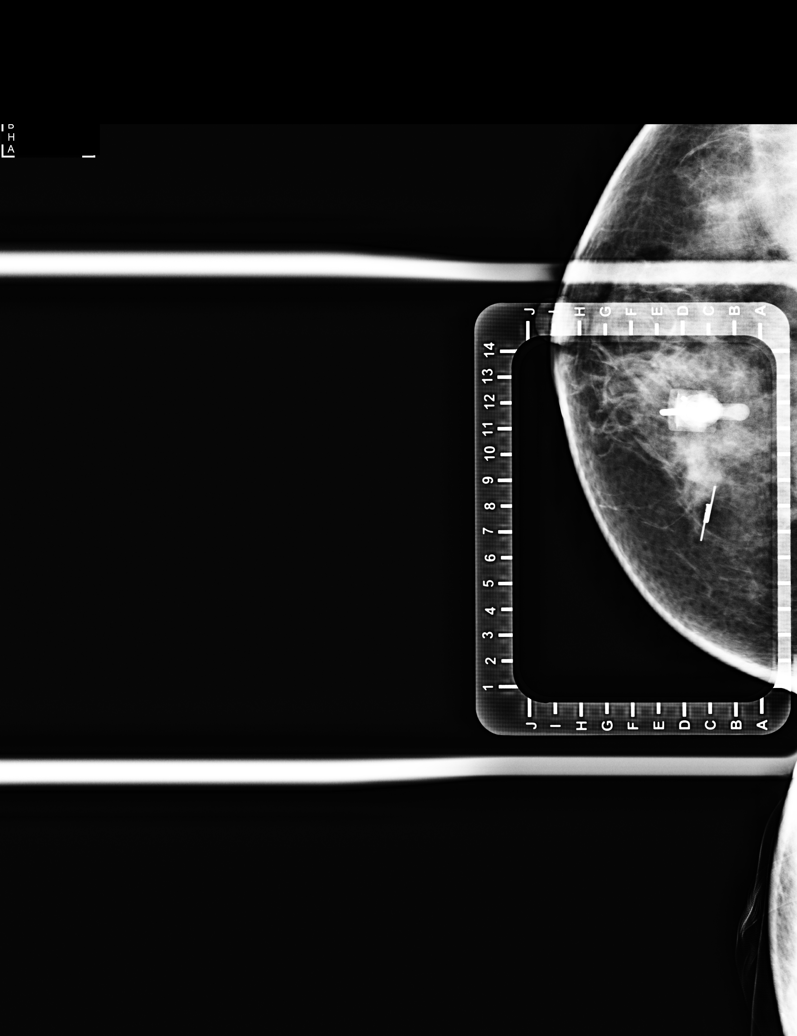

[6 of 6 positions shown; findings below may reference images not displayed]

FINDINGS: Patient presents for radioactive seed localization prior to
excisional biopsy of 2 right breast fibroadenomas. I met with the
patient and we discussed the procedure of seed localization
including benefits and alternatives. We discussed the high
likelihood of a successful procedure. We discussed the risks of the
procedure including infection, bleeding, tissue injury and further
surgery. We discussed the low dose of radioactivity involved in the
procedure. Informed, written consent was given.

The usual time-out protocol was performed immediately prior to the
procedure.

Using mammographic guidance, sterile technique, 1% lidocaine and an
D-W0W radioactive seed, the biopsy marking clip in the upper
slightly inner right breast was localized using a superior approach.
The follow-up mammogram images confirm the seed in the expected
location and were marked for Dr. Aujla.

Follow-up survey of the patient confirms presence of the radioactive
seed.

Order number of D-W0W seed:  [id number].

Total activity:  0.251 millicuries reference Date: 03/04/2022

Using mammographic guidance, sterile technique, 1% lidocaine and an
D-W0W radioactive seed, the TIGER scout radiofrequency device in the
slightly lower inner right breast was localized using a medial
approach. The follow-up mammogram images confirm the seed in the
expected location and were marked for Dr. Aujla.

Follow-up survey of the patient confirms presence of the radioactive
seed.

Order number of D-W0W seed:  [id number].

Total activity:  0.251 millicuries reference Date: 03/04/2022

The patient tolerated the procedure well and was released from the
[REDACTED]. She was given instructions regarding seed removal.
IMPRESSION: 1. Radioactive seed localization the fibroadenoma in the upper inner
right breast. No apparent complications.

2. Radioactive seed localization of the fibroadenoma in the
lower-inner right breast. No apparent complications.

## 2023-04-12 IMAGING — MG MM BREAST SURGICAL SPECIMEN
1 series · 1 of 1 positions shown · non-contrast
Comparison: None Available.

CLINICAL DATA: Evaluate specimen

EXAM:
SPECIMEN RADIOGRAPH OF THE RIGHT BREAST

[R]
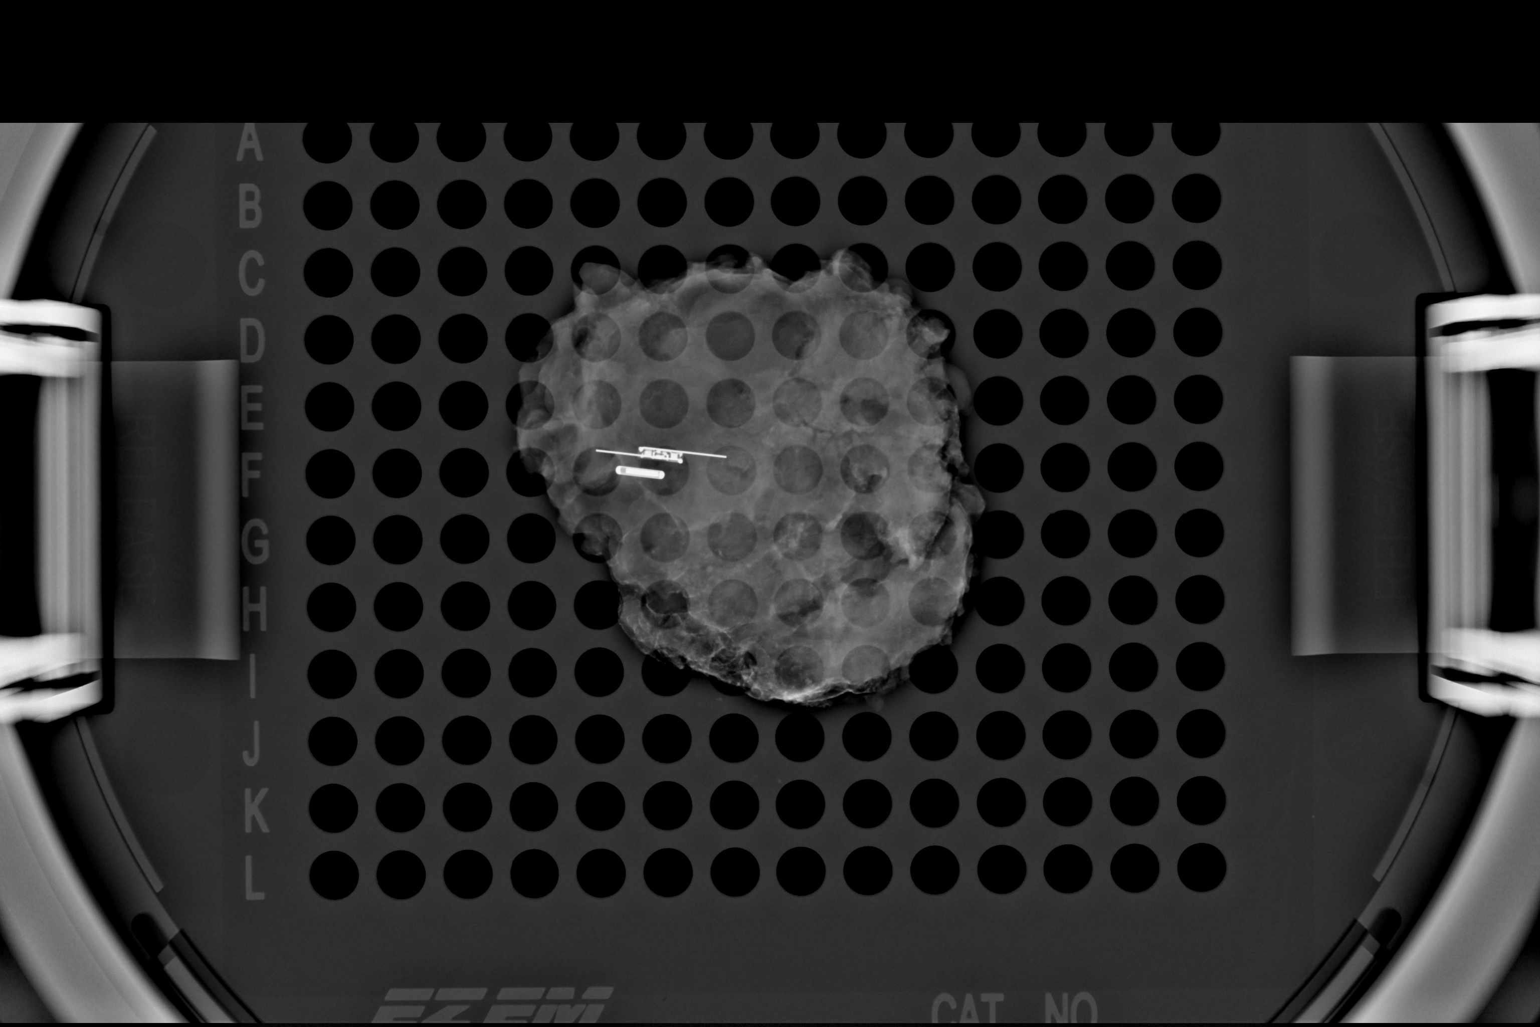

[1 of 1 positions shown; findings below may reference images not displayed]

FINDINGS: Status post excision of the right breast. The radioactive seed and
radiofrequency device are present, completely intact, and were
marked for pathology.
IMPRESSION: Specimen radiograph of the right breast.

## 2023-04-12 IMAGING — MG MM BREAST SURGICAL SPECIMEN
1 series · 1 of 1 positions shown · non-contrast
Comparison: None Available.

CLINICAL DATA: Evaluate specimen

EXAM:
SPECIMEN RADIOGRAPH OF THE RIGHT BREAST

[R]
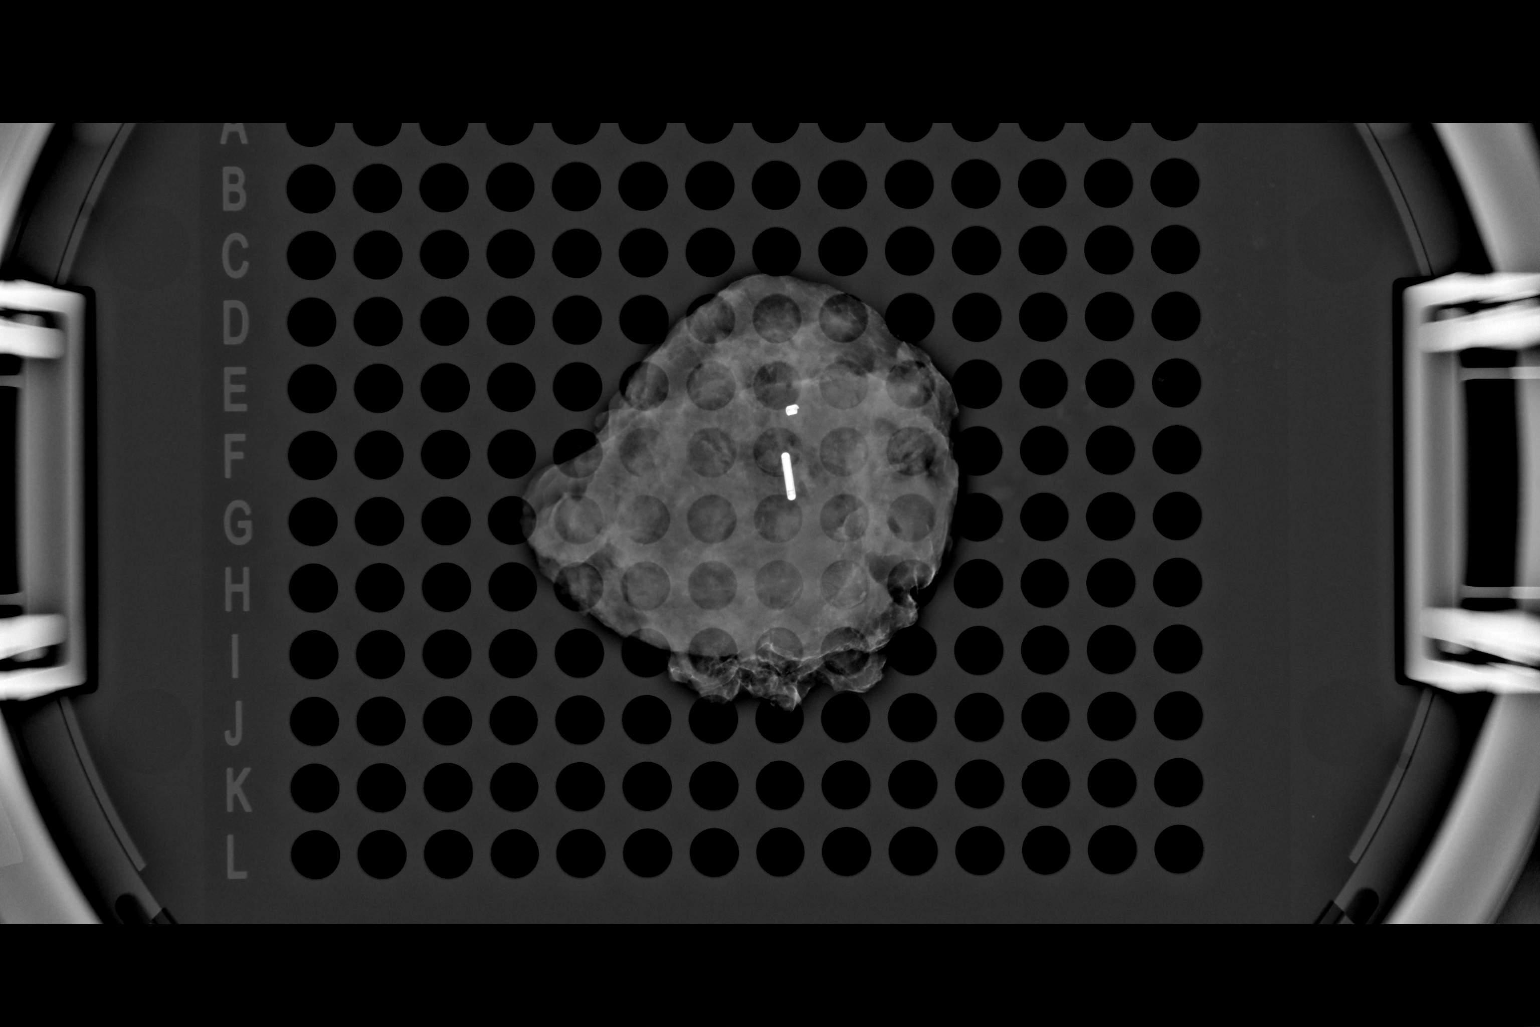

[1 of 1 positions shown; findings below may reference images not displayed]

FINDINGS: Status post excision of the right breast. The radioactive seed and
biopsy marker clip are present, completely intact, and were marked
for pathology.
IMPRESSION: Specimen radiograph of the right breast.
